# Patient Record
Sex: Male | Born: 2000 | Race: White | Hispanic: No | Marital: Single | Smoking: Never smoker
Health system: Southern US, Community
[De-identification: ages and names within clinical notes are randomized; demographics above are authoritative.]

---
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
## 2018-07-13 IMAGING — MR MR CERVICAL SPINE W/O CM
19 of 23 series · 36 of 48 positions shown · non-contrast
Comparison: Brain MRI today reported separately. Cervical spine CT
8716 hours today.

CLINICAL DATA: 17-year-old male with altered mental status, heard
to have fallen and then found unresponsive while in the shower.
Currently intubated and sedated.

EXAM:
MRI CERVICAL, AND THORACIC SPINE WITHOUT CONTRAST
TECHNIQUE: Multiplanar and multiecho pulse sequences of the cervical and
thoracic spine, were obtained without intravenous contrast.

[Series 2: T2 · sagittal · 3.0mm · 0.70mm/px · 1 of 15 slices shown (1 of 7)]
[im 1/15]
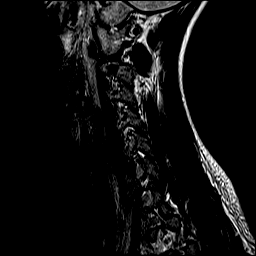

[Series 3: T1 · sagittal · 3.0mm · 0.70mm/px · 1 of 15 slices shown (1 of 2)]
[im 1/15]
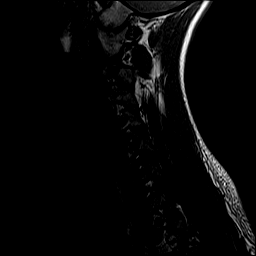

[Series 4: STIR · sagittal · 3.0mm · 0.70mm/px · 1 of 15 slices shown (1 of 2)]
[im 1/15]
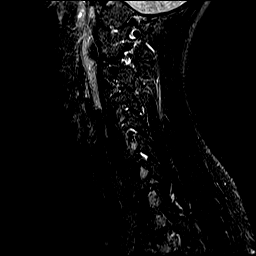

[Series 5: T2 · axial · 3.0mm · 0.70mm/px · 1 of 31 slices shown (2 of 7)]
[im 1/31]
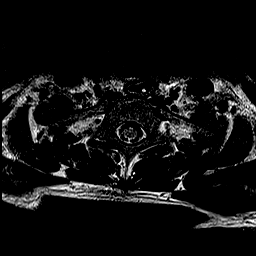

[Series 6: mpgr ax · axial · 3.0mm · 0.35mm/px · z∈[-137,-24]mm · 2 of 31 slices shown]
[im 1/31]
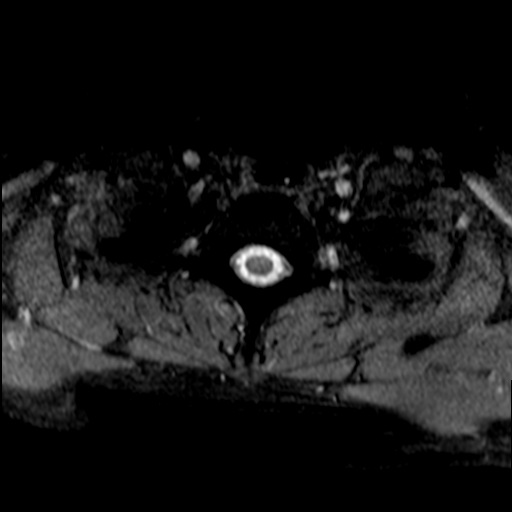
[im 31/31]
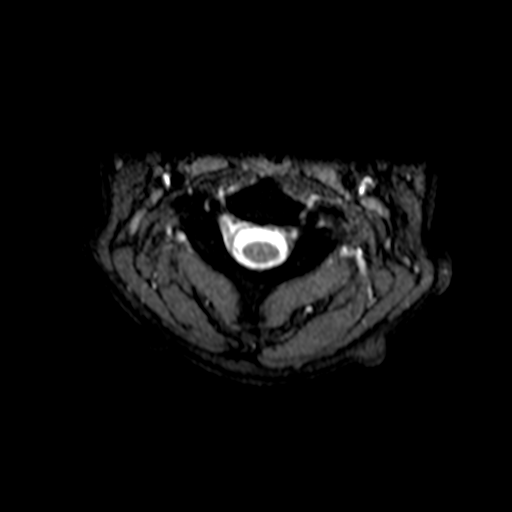

[Series 8: sag loc for · sagittal · 5.0mm · 0.68mm/px · 1 of 8 slices shown]
[im 1/8]
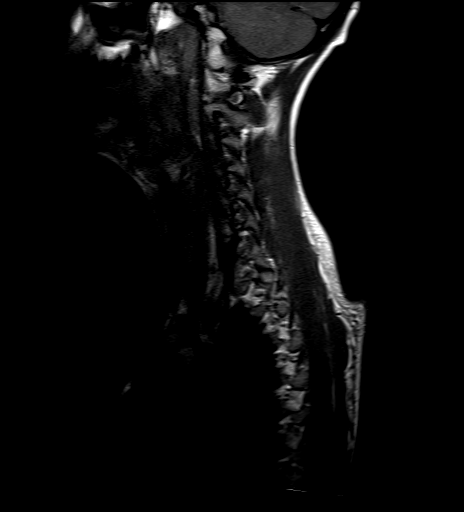

[Series 10: T2 · sagittal · 4.0mm · 1.41mm/px · 1 of 15 slices shown (3 of 7)]
[im 1/15]
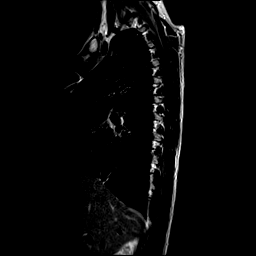

[Series 11: T1 · sagittal · 4.0mm · 1.41mm/px · 1 of 15 slices shown (2 of 2)]
[im 1/15]
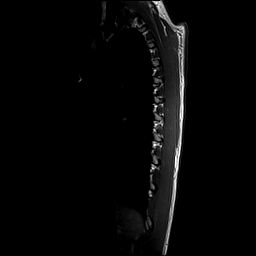

[Series 12: STIR · sagittal · 4.0mm · 1.41mm/px · 1 of 15 slices shown (2 of 2)]
[im 1/15]
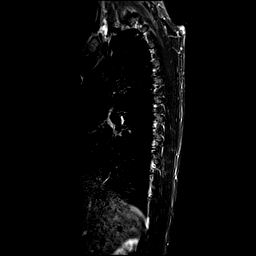

[Series 13: T2 · axial · 5.0mm · 0.86mm/px · z∈[-416,-125]mm · 3 of 39 slices shown (4 of 7)]
[im 1/39]
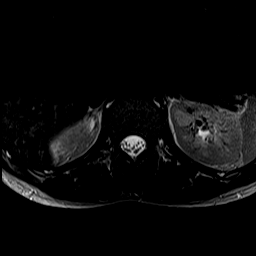
[im 20/39]
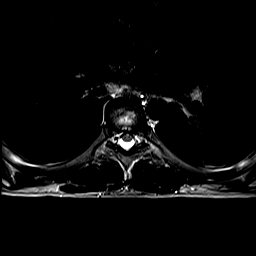
[im 39/39]
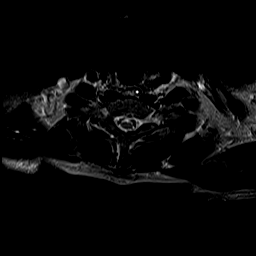

[Series 14: mpgr ax (3 · axial · 5.0mm · 0.78mm/px · z∈[-418,-122]mm · 3 of 40 slices shown]
[im 1/40]
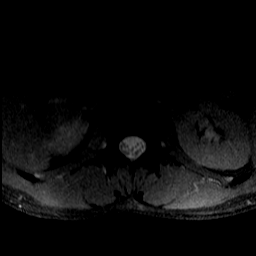
[im 20/40]
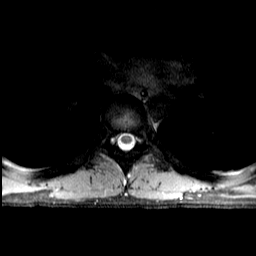
[im 40/40]
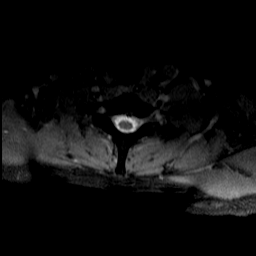

[Series 16: GRE · sagittal · 5.0mm · 0.45mm/px · 2 of 27 slices shown (1 of 2)]
[im 1/27]
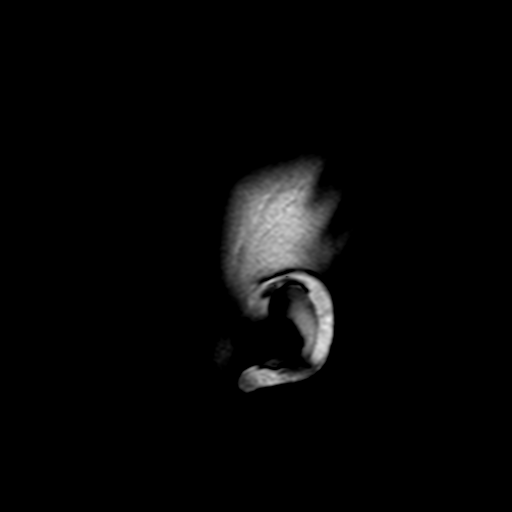
[im 27/27]
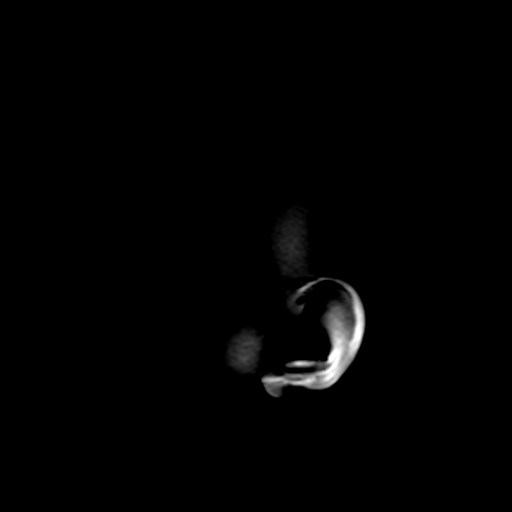

[Series 18: DWI · axial · 3.0mm · 1.80mm/px · z∈[-86,+76]mm · 4 of 53 slices shown (1 of 2)]
[im 1/53]
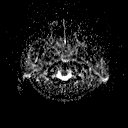
[im 18/53]
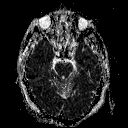
[im 35/53]
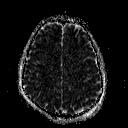
[im 53/53]
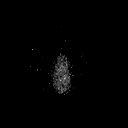

[Series 20: DWI · coronal · 3.0mm · 1.80mm/px · 3 of 49 slices shown (2 of 2)]
[im 1/49]
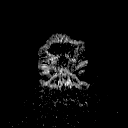
[im 25/49]
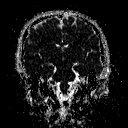
[im 49/49]
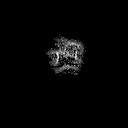

[Series 21: T2 · axial · 5.0mm · 0.45mm/px · z∈[-93,+76]mm · 2 of 27 slices shown (5 of 7)]
[im 1/27]
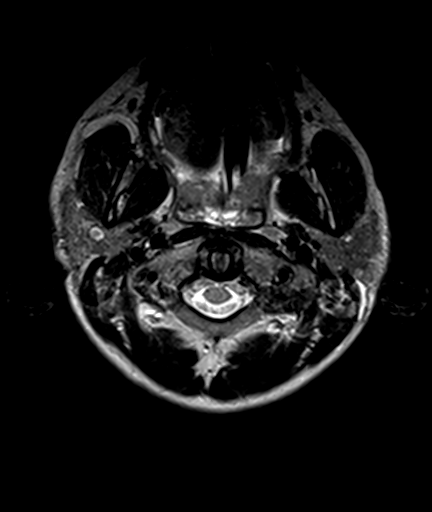
[im 27/27]
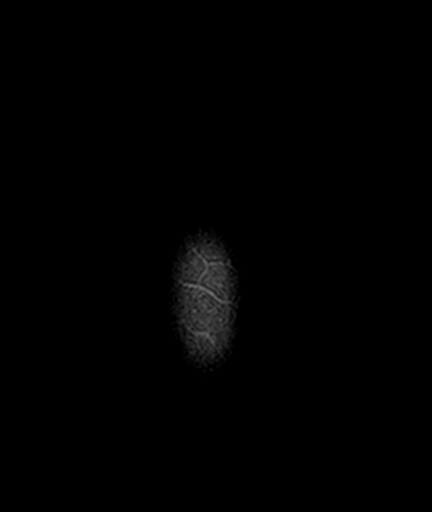

[Series 22: FLAIR · axial · 3.0mm · 0.45mm/px · z∈[-91,+74]mm · 4 of 56 slices shown]
[im 1/56]
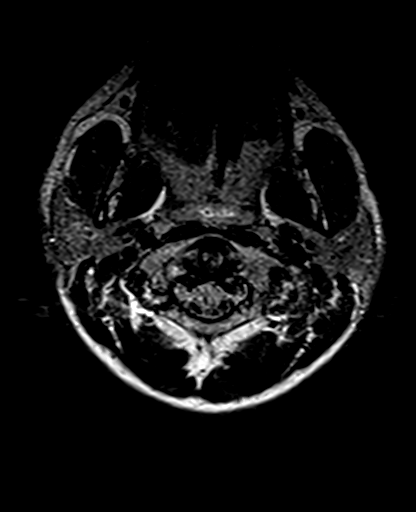
[im 19/56]
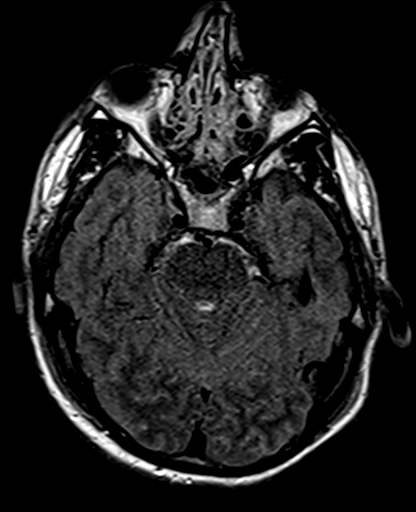
[im 37/56]
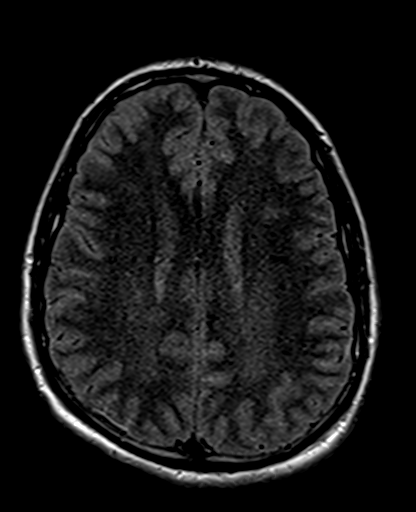
[im 56/56]
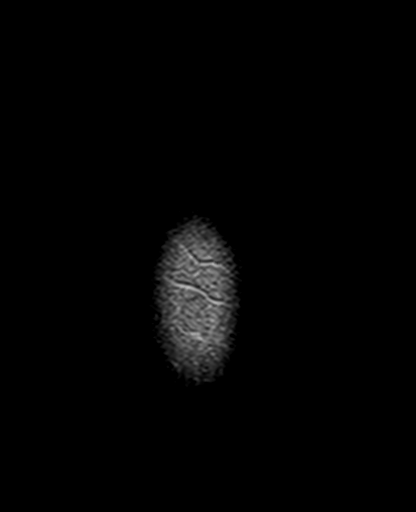

[Series 23: T2 · axial · 5.0mm · 1.20mm/px · z∈[-93,+76]mm · 2 of 27 slices shown (6 of 7)]
[im 1/27]
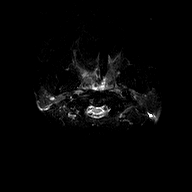
[im 27/27]
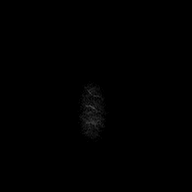

[Series 24: GRE · axial · 5.0mm · 0.45mm/px · 1 of 27 slices shown (2 of 2)]
[im 1/27]
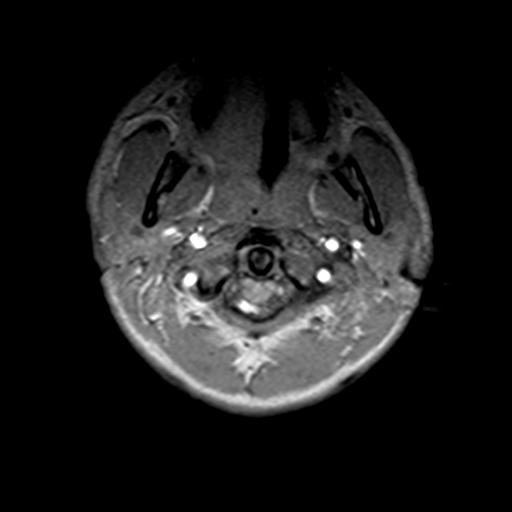

[Series 25: T2 · coronal · 5.0mm · 0.43mm/px · 2 of 30 slices shown (7 of 7)]
[im 1/30]
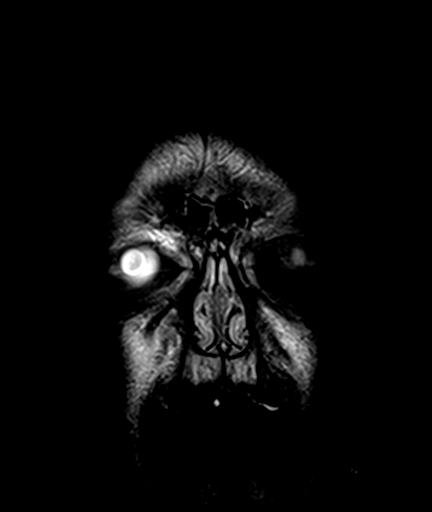
[im 30/30]
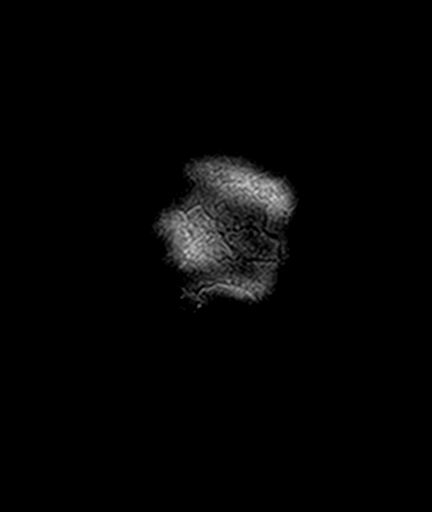

[36 of 48 positions shown; findings below may reference images not displayed]

FINDINGS: MRI CERVICAL SPINE FINDINGS

Alignment: Stable straightening of cervical lordosis.

Vertebrae: No marrow edema or evidence of acute osseous abnormality.
Visualized bone marrow signal is within normal limits.

Cord: Although sagittal STIR imaging suggests abnormal signal
(series 4, images 7 and 8), there is no corresponding cervical
spinal cord signal abnormality or abnormal cord morphology on
sagittal T2, axial T2, or axial gradient imaging. Therefore, the
spinal cord STIR appearance is felt to be artifactual.

Posterior Fossa, vertebral arteries, paraspinal tissues:
Cervicomedullary junction is within normal limits.

An enteric tube is visible in the hypopharynx and tracking into the
upper thoracic esophagus. There is fluid layering in the pharynx.

Abnormal STIR hyperintensity in the cervical spine interspinous
ligaments from C3-C4 to C6-C7. See series 4, image 7. however, the
bilateral erector spinae muscles remain normal. No other abnormal
paraspinal soft tissue finding.

Disc levels:

Negative. No cervical disc herniation, spinal stenosis or neural
foraminal stenosis.

MRI THORACIC SPINE FINDINGS

Thoracic spine segmentation:  Appears normal.

Alignment:  Normal thoracic vertebral alignment.

Vertebrae: There is a mild superior endplate deformity of T1 (series
11, image 9), but subtle if any associated vertebral body STIR
hyperintensity (series 12, image 9). The remaining thoracic
vertebrae appear intact with normal marrow signal. No marrow edema
identified elsewhere.

Cord: The thoracic spinal cord and thecal sac appear within normal
limits throughout. There is CSF pulsation artifact suspected along
the dorsal cord at T2 and T3. No convincing epidural space
abnormality. No spinal cord signal abnormality. The conus appears
normal at T11-T12. The visible cauda equina nerve roots are normal.

Paraspinal and other soft tissues: Enteric tube courses in the
esophagus to the stomach, tip not included. Trace fluid layering in
the trachea. Otherwise negative visible thoracic and upper abdominal
viscera.

No definite thoracic paraspinal soft tissue injury or signal
abnormality.

Disc levels:

There are tiny central disc herniation suspected at both T4-T5
(series 14, image 15), and T5-T6 (image 18). There is no associated
spinal or foraminal stenosis.

Otherwise negative. No thoracic disc herniation, spinal or foraminal
stenosis.
IMPRESSION: CERVICAL SPINE:

1. Positive for posterior ligamentous injury: interspinous ligament
injury from C3-C4 to C6-C7.
2. Otherwise negative MRI appearance of the cervical spine.

THORACIC SPINE:

1. Mild posttraumatic T1 superior endplate compression fracture is
redemonstrated, but might be chronic rather than acute.
2. Tiny thoracic disc herniations at T4-T5 and T5-T6, with no
associated spinal stenosis or neural impingement.
3. Otherwise negative MRI appearance of the thoracic spine.
